# Patient Record
Sex: Male | Born: 1975 | Race: White | Hispanic: No | Marital: Married | Smoking: Never smoker
Health system: Southern US, Community
[De-identification: ages and names within clinical notes are randomized; demographics above are authoritative.]

---

## 2019-01-22 ENCOUNTER — Other Ambulatory Visit: Payer: Self-pay

## 2019-01-22 ENCOUNTER — Ambulatory Visit (HOSPITAL_COMMUNITY)
Admission: EM | Admit: 2019-01-22 | Discharge: 2019-01-23 | Disposition: A | Discharge status: Home / Self Care | Payer: Self-pay | Attending: Emergency Medicine | Admitting: Emergency Medicine

## 2019-01-22 DIAGNOSIS — K222 Esophageal obstruction: Secondary | ICD-10-CM | POA: Insufficient documentation

## 2019-01-22 DIAGNOSIS — X58XXXA Exposure to other specified factors, initial encounter: Secondary | ICD-10-CM | POA: Insufficient documentation

## 2019-01-22 DIAGNOSIS — Z1159 Encounter for screening for other viral diseases: Secondary | ICD-10-CM | POA: Insufficient documentation

## 2019-01-22 DIAGNOSIS — R1310 Dysphagia, unspecified: Secondary | ICD-10-CM | POA: Insufficient documentation

## 2019-01-22 DIAGNOSIS — T18128A Food in esophagus causing other injury, initial encounter: Secondary | ICD-10-CM | POA: Insufficient documentation

## 2019-01-22 DIAGNOSIS — K219 Gastro-esophageal reflux disease without esophagitis: Secondary | ICD-10-CM | POA: Insufficient documentation

## 2019-01-22 DIAGNOSIS — K449 Diaphragmatic hernia without obstruction or gangrene: Secondary | ICD-10-CM | POA: Insufficient documentation

## 2019-01-22 NOTE — ED Triage Notes (Signed)
Per pt he has hx of being trache and has hx of food getting stuck in his throat. Pt says he can feel the food  About half way down.

## 2019-01-23 ENCOUNTER — Encounter (HOSPITAL_COMMUNITY): Payer: Self-pay | Admitting: *Deleted

## 2019-01-23 ENCOUNTER — Encounter (HOSPITAL_COMMUNITY)
Admission: EM | Disposition: A | Discharge status: Home / Self Care | Payer: Self-pay | Source: Home / Self Care | Attending: Emergency Medicine

## 2019-01-23 ENCOUNTER — Emergency Department (HOSPITAL_COMMUNITY): Payer: Self-pay

## 2019-01-23 DIAGNOSIS — T18128A Food in esophagus causing other injury, initial encounter: Secondary | ICD-10-CM

## 2019-01-23 DIAGNOSIS — K449 Diaphragmatic hernia without obstruction or gangrene: Secondary | ICD-10-CM

## 2019-01-23 DIAGNOSIS — K222 Esophageal obstruction: Secondary | ICD-10-CM

## 2019-01-23 DIAGNOSIS — R1310 Dysphagia, unspecified: Secondary | ICD-10-CM

## 2019-01-23 HISTORY — PX: ESOPHAGOGASTRODUODENOSCOPY: SHX5428

## 2019-01-23 HISTORY — PX: FOREIGN BODY REMOVAL: SHX962

## 2019-01-23 LAB — CBC WITH DIFFERENTIAL/PLATELET
Abs Immature Granulocytes: 0.02 10*3/uL (ref 0.00–0.07)
Basophils Absolute: 0.1 10*3/uL (ref 0.0–0.1)
Basophils Relative: 1 %
Eosinophils Absolute: 0.4 10*3/uL (ref 0.0–0.5)
Eosinophils Relative: 6 %
HCT: 46.3 % (ref 39.0–52.0)
Hemoglobin: 14.8 g/dL (ref 13.0–17.0)
Immature Granulocytes: 0 %
Lymphocytes Relative: 26 %
Lymphs Abs: 1.8 10*3/uL (ref 0.7–4.0)
MCH: 29.2 pg (ref 26.0–34.0)
MCHC: 32 g/dL (ref 30.0–36.0)
MCV: 91.3 fL (ref 80.0–100.0)
Monocytes Absolute: 0.6 10*3/uL (ref 0.1–1.0)
Monocytes Relative: 9 %
Neutro Abs: 4 10*3/uL (ref 1.7–7.7)
Neutrophils Relative %: 58 %
Platelets: 177 10*3/uL (ref 150–400)
RBC: 5.07 MIL/uL (ref 4.22–5.81)
RDW: 13 % (ref 11.5–15.5)
WBC: 6.8 10*3/uL (ref 4.0–10.5)
nRBC: 0 % (ref 0.0–0.2)

## 2019-01-23 LAB — BASIC METABOLIC PANEL
Anion gap: 11 (ref 5–15)
BUN: 21 mg/dL — ABNORMAL HIGH (ref 6–20)
CO2: 26 mmol/L (ref 22–32)
Calcium: 9.7 mg/dL (ref 8.9–10.3)
Chloride: 104 mmol/L (ref 98–111)
Creatinine, Ser: 1.18 mg/dL (ref 0.61–1.24)
GFR calc Af Amer: >60 mL/min (ref >60)
GFR calc non Af Amer: >60 mL/min (ref >60)
Glucose, Bld: 95 mg/dL (ref 70–99)
Potassium: 3.6 mmol/L (ref 3.5–5.1)
Sodium: 141 mmol/L (ref 135–145)

## 2019-01-23 LAB — SARS CORONAVIRUS 2 BY RT PCR (HOSPITAL ORDER, PERFORMED IN ~~LOC~~ HOSPITAL LAB): SARS Coronavirus 2: NEGATIVE

## 2019-01-23 SURGERY — EGD (ESOPHAGOGASTRODUODENOSCOPY)
Anesthesia: Moderate Sedation

## 2019-01-23 MED ORDER — FENTANYL CITRATE (PF) 100 MCG/2ML IJ SOLN
INTRAMUSCULAR | Status: DC | PRN
  Administered 2019-01-23 (×2): 25 ug via INTRAVENOUS

## 2019-01-23 MED ORDER — OMEPRAZOLE 40 MG PO CPDR: 40.0000 mg | DELAYED_RELEASE_CAPSULE | Freq: Two times a day (BID) | ORAL | 3 refills | Status: AC

## 2019-01-23 MED ORDER — DIPHENHYDRAMINE HCL 50 MG/ML IJ SOLN
INTRAMUSCULAR | Status: AC
  Filled 2019-01-23: qty 1

## 2019-01-23 MED ORDER — FENTANYL CITRATE (PF) 100 MCG/2ML IJ SOLN
INTRAMUSCULAR | Status: AC
  Filled 2019-01-23: qty 4

## 2019-01-23 MED ORDER — ONDANSETRON HCL 4 MG/2ML IJ SOLN
4.0000 mg | Freq: Once | INTRAMUSCULAR | Status: AC
  Administered 2019-01-23: 4 mg via INTRAVENOUS
  Filled 2019-01-23: qty 2

## 2019-01-23 MED ORDER — SUCRALFATE 1 GM/10ML PO SUSP: 1.0000 g | Freq: Four times a day (QID) | ORAL | 1 refills | Status: AC

## 2019-01-23 MED ORDER — GLUCAGON HCL RDNA (DIAGNOSTIC) 1 MG IJ SOLR
1.0000 mg | Freq: Once | INTRAMUSCULAR | Status: AC
  Administered 2019-01-23: 1 mg via INTRAVENOUS
  Filled 2019-01-23: qty 1

## 2019-01-23 MED ORDER — MIDAZOLAM HCL (PF) 10 MG/2ML IJ SOLN
INTRAMUSCULAR | Status: DC | PRN
  Administered 2019-01-23: 1 mg via INTRAVENOUS
  Administered 2019-01-23: 2 mg via INTRAVENOUS
  Administered 2019-01-23: 1 mg via INTRAVENOUS

## 2019-01-23 MED ORDER — MIDAZOLAM HCL (PF) 5 MG/ML IJ SOLN
INTRAMUSCULAR | Status: AC
  Filled 2019-01-23: qty 3

## 2019-01-23 MED ORDER — LORAZEPAM 2 MG/ML IJ SOLN
0.5000 mg | Freq: Once | INTRAMUSCULAR | Status: AC
  Administered 2019-01-23: 0.5 mg via INTRAVENOUS
  Filled 2019-01-23: qty 1

## 2019-01-23 MED ORDER — BUTAMBEN-TETRACAINE-BENZOCAINE 2-2-14 % EX AERO
INHALATION_SPRAY | CUTANEOUS | Status: DC | PRN
  Administered 2019-01-23: 1 via TOPICAL

## 2019-01-23 NOTE — Interval H&P Note (Signed)
History and Physical Interval Note:  01/23/2019 5:15 AM  Devin Wiggins  has presented today for surgery, with the diagnosis of food bolus.  The various methods of treatment have been discussed with the patient and family. After consideration of risks, benefits and other options for treatment, the patient has consented to  Procedure(s): ESOPHAGOGASTRODUODENOSCOPY (EGD) (N/A) as a surgical intervention.  The patient's history has been reviewed, patient examined, no change in status, stable for surgery.  I have reviewed the patient's chart and labs.  Questions were answered to the patient's satisfaction.     Verlin Dike Terryann Verbeek

## 2019-01-23 NOTE — ED Notes (Signed)
Pt was taken up to endo, to get food taken out. Equipment was not working down here.

## 2019-01-23 NOTE — ED Provider Notes (Signed)
[facility] EMERGENCY DEPARTMENT Provider Note   CSN: [id number] Arrival date & time: 01/22/19  2320    History   Chief Complaint Chief Complaint  Patient presents with  . Emesis    food stuck in throat    HPI Devin Wiggins is a 43 y.o. male.     Patient with history of recurrent food impaction presenting with concern for same.  States has had difficulty swallowing ever since eating some chicken casserole tonight.  He is been spitting up an emesis bag and having difficult time controlling his secretions.  This is the fourth time this is happened.  Most recently this happened in [place] in 2015.  He has required endoscopy in the past.  Patient with history of tracheostomy remotely which is likely the source of this by his report.  He states he was feeling fine prior to eating the chicken tonight.  He denies any difficulty breathing.  No chest pain or abdominal pain.  No fevers or chills.  The history is provided by the patient.  Emesis  Associated symptoms: no abdominal pain, no arthralgias, no fever, no headaches and no myalgias     No past medical history on file.  There are no active problems to display for this patient.   * The histories are not reviewed yet. Please review them in the "History" navigator section and refresh this SmartLink.      Home Medications    Prior to Admission medications   Not on File    Family History No family history on file.  Social History Social History   Tobacco Use  . Smoking status: Not on file  Substance Use Topics  . Alcohol use: Not on file  . Drug use: Not on file     Allergies   Patient has no allergy information on record.   Review of Systems Review of Systems  Constitutional: Negative for activity change, appetite change and fever.  HENT: Negative for congestion and rhinorrhea.   Respiratory: Negative for shortness of breath.   Cardiovascular: Negative for chest pain.   Gastrointestinal: Positive for nausea and vomiting. Negative for abdominal pain.  Genitourinary: Negative for dysuria and hematuria.  Musculoskeletal: Negative for arthralgias and myalgias.  Skin: Negative for rash.  Neurological: Negative for dizziness, weakness and headaches.    all other systems are negative except as noted in the HPI and PMH.    Physical Exam Updated Vital Signs BP (!) 153/106 (BP Location: Right Arm)   Pulse 77   Temp 98.5 F (36.9 C) (Oral)   Resp 18   SpO2 98%   Physical Exam Vitals signs and nursing note reviewed.  Constitutional:      General: He is not in acute distress.    Appearance: He is well-developed.     Comments: Speaking in full sentences, no distress , Spitting into emesis bag  HENT:     Head: Normocephalic and atraumatic.     Mouth/Throat:     Pharynx: No oropharyngeal exudate.  Eyes:     Conjunctiva/sclera: Conjunctivae normal.     Pupils: Pupils are equal, round, and reactive to light.  Neck:     Musculoskeletal: Normal range of motion and neck supple.     Comments: No meningismus. Cardiovascular:     Rate and Rhythm: Normal rate and regular rhythm.     Heart sounds: Normal heart sounds. No murmur.  Pulmonary:     Effort: Pulmonary effort is normal. No respiratory distress.  Breath sounds: Normal breath sounds.  Abdominal:     Palpations: Abdomen is soft.     Tenderness: There is no abdominal tenderness. There is no guarding or rebound.  Musculoskeletal: Normal range of motion.        General: No tenderness.  Skin:    General: Skin is warm.  Neurological:     Mental Status: He is alert and oriented to person, place, and time.     Cranial Nerves: No cranial nerve deficit.     Motor: No abnormal muscle tone.     Coordination: Coordination normal.     Comments: No ataxia on finger to nose bilaterally. No pronator drift. 5/5 strength throughout. CN 2-12 intact.Equal grip strength. Sensation intact.   Psychiatric:         Behavior: Behavior normal.      ED Treatments / Results  Labs (all labs ordered are listed, but only abnormal results are displayed) Labs Reviewed  BASIC METABOLIC PANEL - Abnormal; Notable for the following components:      Result Value   BUN 21 (*)    All other components within normal limits  SARS CORONAVIRUS 2 (HOSPITAL ORDER, PERFORMED IN [facility])  CBC WITH DIFFERENTIAL/PLATELET    EKG None  Radiology Dg Chest Portable 1 View  Result Date: 01/23/2019 CLINICAL DATA:  Dysphagia EXAM: PORTABLE CHEST 1 VIEW COMPARISON:  None. FINDINGS: The heart size and mediastinal contours are within normal limits. Both lungs are clear. The visualized skeletal structures are unremarkable. IMPRESSION: Clear lungs. Electronically Signed   By: Deatra RobinsonKevin  Herman M.D.   On: 01/23/2019 00:46    Procedures Procedures (including critical care time)  Medications Ordered in ED Medications  glucagon (human recombinant) (GLUCAGEN) injection 1 mg (has no administration in time range)  LORazepam (ATIVAN) injection 0.5 mg (has no administration in time range)  ondansetron (ZOFRAN) injection 4 mg (has no administration in time range)     Initial Impression / Assessment and Plan / ED Course  I have reviewed the triage vital signs and the nursing notes.  Pertinent labs & imaging results that were available during my care of the patient were reviewed by me and considered in my medical decision making (see chart for details).       Concern for esophageal food impaction with history of the same.  Patient requesting glucagon and benzodiazepines and was would like to avoid endoscopy if possible.  Patient given IV Zofran, benzodiazepine and glucagon.  This was not successful and patient continued to spit in the emesis bag with difficulty controlling his secretions. Discussed with gastroenterology Dr. Barron Alvineirigliano.  He came to see patient.  EGD equipment was not functioning in the ED and  patient was taken to the endoscopy suite. Final Clinical Impressions(s) / ED Diagnoses   Final diagnoses:  Esophageal obstruction due to food impaction    ED Discharge Orders    None       Cally Nygard, Jeannett SeniorStephen, MD 01/23/19 0602

## 2019-01-23 NOTE — Consult Note (Signed)
Consultation  Referring Provider:     Glynn Octaveancour, Stephen, MD Primary Care Physician:  Patient, No Pcp Per Primary Gastroenterologist:     Gentry FitzUnassigned    Reason for Consultation:     Food impaction         HPI:   Devin Wiggins is a 43 y.o. male presenting to the ER with food impaction after eating chicken casserole earlier tonight. He has a hx of dysphagia, with several prior EGDs for food impactions, with the last episode approx 5 years ago. Was scheduled for EGD with dilation, but never completed this prior to moving from [place]. Did have an Esophageal Manometry completed there, which he thinks was abnormal, but not certain. 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 No issues with sedation with prior procedures. All previous GI/ER care was in [place]. He was o/w in his usual state of health before sxs onset. Has not been having dysphagia since impaction 5 years ago. Does take Prilosec 20 mg daily for hx of reflux.   Unable to tolerate secretions, sitting upright and spitting into emesis bag.   In the ER, no improvement with trial of Ativan, Glucagon, Zofran.  Normal CBC and CMP. Normal CXR. COVID-19 negative.  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 Smoking status: Not on file  Substance Use Topics  . Alcohol use: Not on file  . Drug use: Not on file    Prior to Admission medications   Medication Sig Start Date End Date Taking? Authorizing Provider  omeprazole (PRILOSEC) 20 MG capsule Take 20 mg by mouth daily.   Yes [provider]    No current facility-administered medications for this encounter.    Current Outpatient Medications  Medication Sig Dispense Refill  . omeprazole (PRILOSEC) 20 MG capsule Take 20 mg by mouth daily.      Allergies as of 01/22/2019  . (Not on File)     Review of Systems:    As per HPI,  otherwise negative    Physical Exam:  Vital signs in last 24 hours: Temp:  [97.8 F (36.6 C)-98.5 F (36.9 C)] 98.5 F (36.9 C) (05/14 2333) Pulse Rate:  [66-77] 66 (05/15 0015) Resp:  [16-18] 18 (05/14 2333) BP: (140-153)/(98-106) 143/98 (05/15 0015) SpO2:  [96 %-98 %] 96 % (05/15 0015)   General:   Pleasant male in NAD Head:  Normocephalic and atraumatic. Eyes:   No icterus.   Conjunctiva pink. Ears:  Normal auditory acuity. Neck:  Supple Lungs:  Respirations even and unlabored. Lungs clear to auscultation bilaterally.   No wheezes, crackles, or rhonchi.  Heart:  Regular rate and rhythm; no MRG Abdomen:  Soft, nondistended, nontender. Normal bowel sounds. No appreciable masses or hepatomegaly.  Rectal:  Not performed.  Msk:  Symmetrical without gross deformities.  Extremities:  Without edema. Neurologic:  Alert and  oriented x4;  grossly normal neurologically. Skin:  Intact without significant lesions or rashes. Psych:  Alert and cooperative. Normal affect.  LAB RESULTS: Recent Labs    01/23/19 0010  WBC 6.8  HGB 14.8  HCT 46.3  PLT 177   BMET Recent Labs    01/23/19 0010  NA 141  K 3.6  CL 104  CO2 26  GLUCOSE 95  BUN 21*  CREATININE 1.18  CALCIUM 9.7  LFT No results for input(s): PROT, ALBUMIN, AST, ALT, ALKPHOS, BILITOT, BILIDIR, IBILI in the last 72 hours. PT/INR No results for input(s): LABPROT, INR in the last 72 hours.  STUDIES: Dg Chest Portable 1 View  Result Date: 01/23/2019 CLINICAL DATA:  Dysphagia EXAM: PORTABLE CHEST 1 VIEW COMPARISON:  None. FINDINGS: The heart size and mediastinal contours are within normal limits. Both lungs are clear. The visualized skeletal structures are unremarkable. IMPRESSION: Clear lungs. Electronically Signed   By: Deatra Robinson M.D.   On: 01/23/2019 00:46     PREVIOUS ENDOSCOPIES:            All previous enodoscopies in [place]. Last approx 5 years ago per patient. None in EMR for review.    Impression /  Plan:   43 yo male with acute food impaction after eating chicken caserole this evening.   - Plan for emergent EGD in the ER with moderate sedation and food retrieval - Confirmed no allergies to sedating medications  The indications, risks, and benefits of EGD were explained to the patient in detail. Risks include but are not limited to bleeding, perforation, adverse reaction to medications, and cardiopulmonary compromise. Sequelae include but are not limited to the possibility of surgery, hositalization, and mortality. The patient verbalized understanding and wished to proceed. All questions answered. Additonal recommendations pending endoscopic findings.   Thanks   LOS: 0 days   Shellia Cleverly  01/23/2019, 1:29 AM

## 2019-01-23 NOTE — ED Provider Notes (Signed)
Pt is now awake after EGD by Dr. Barron Alvine.  Pt is stable for d/c.  Return if worse.  F/u with [facility].   Jacalyn Lefevre, MD 01/23/19 [phone]

## 2019-01-23 NOTE — Discharge Instructions (Signed)
Full liquid diet today and advance slowly as tolerated to soft foods tomorrow. Do not advance beyond soft foods until follow-up appointment in the GI Clinic. - Increase Prilosec (omeprazole) to 40 mg PO BID for 8 weeks to promote mucosal healing, then can reduce to 40 mg daily. - Use sucralfate suspension 1 gram PO QID for 2 weeks. - Return to GI clinic in 2 weeks. Will call to schedule this appointment. - Repeat upper endoscopy in 4 weeks to check healing and plan for esophageal biopsies and esophageal dilation at that time.  ---

## 2019-01-23 NOTE — Op Note (Signed)
[facility] Patient Name: Devin ReadyJeremy Gelber Procedure Date : 01/23/2019 MRN: [id number] Attending MD: Doristine LocksVito Cirigliano , MD Date of Birth: 24-Nov-1975 CSN: [id number] Age: [age] Admit Type: Emergency Department Procedure:                Upper GI endoscopy Indications:              Dysphagia, Foreign body in the esophagus (food                            impaction)                           43 yo male with history of food impactions, with                            the last occurance in 2016 while still living in                            [place], presents to the ER with acute food                            impaction after eating chicken. Providers:                Doristine LocksVito Cirigliano, MD, Janae SauceKaren D. Hinson, RN, Harrington ChallengerHope                            Parker, Technician Referring MD:              Medicines:                Midazolam 4 mg IV, Fentanyl 50 micrograms IV Complications:            No immediate complications. Estimated Blood Loss:     Estimated blood loss was minimal. Procedure:                Pre-Anesthesia Assessment:                           - Prior to the procedure, a History and Physical                            was performed, and patient medications and                            allergies were reviewed. The patient's tolerance of                            previous anesthesia was also reviewed. The risks                            and benefits of the procedure and the sedation                            options and risks were discussed with the patient.  All questions were answered, and informed consent                            was obtained. Prior Anticoagulants: The patient has                            taken no previous anticoagulant or antiplatelet                            agents. ASA Grade Assessment: I - A normal, healthy                            patient. After reviewing the risks and benefits,                            the  patient was deemed in satisfactory condition to                            undergo the procedure.                           After obtaining informed consent, the endoscope was                            passed under direct vision. Throughout the                            procedure, the patient's blood pressure, pulse, and                            oxygen saturations were monitored continuously. The                            [id number]-H190 ([id number]) Olympus gastroscope was                            introduced through the mouth, and advanced to the                            second part of duodenum. The upper GI endoscopy was                            accomplished without difficulty. The patient                            tolerated the procedure well. Scope In: Scope Out: Findings:      Food bolus were found in the lower third of the esophagus. Removal was       accomplished with a rat-toothed forceps.      One benign-appearing, intrinsic moderate stenosis was found 35 cm from       the incisors. This stenosis measured 1 cm (in length). The stenosis was       traversed. There was felinization in the lower esophagus and some       longitudinal furrows  in the mid esophagus, suggestive of Esoinophilic       Esophagitis. Biopsies were deferred to time of repeat endoscopy.      A 4 cm hiatal hernia was present.      The entire examined stomach was normal.      The duodenal bulb, first portion of the duodenum and second portion of       the duodenum were normal. Impression:               - Food bolus were found in the esophagus. Removal                            was successful.                           - Benign-appearing esophageal stenosis.                           - 4 cm hiatal hernia.                           - Normal stomach.                           - Normal duodenal bulb, first portion of the                            duodenum and second portion of the duodenum. Recommendation:            - Return patient to ER for possible discharge same                            day.                           - Full liquid diet today and advance slowly as                            tolerated to soft foods tomorrow. Do not advance                            beyond soft foods until follow-up appointment in                            the GI Clinic.                           - Increase Prilosec (omeprazole) to 40 mg PO BID                            for 8 weeks to promote mucosal healing, then can                            reduce to 40 mg daily.                           - Use sucralfate suspension 1  gram PO QID for 2                            weeks.                           - Return to GI clinic in 2 weeks. Will call to                            schedule this appointment.                           - Repeat upper endoscopy in 4 weeks to check                            healing and plan for esophageal biopsies and                            esophageal dilation at that time. Procedure Code(s):        --- Professional ---                           [id number], Esophagogastroduodenoscopy, flexible,                            transoral; with removal of foreign body(s) Diagnosis Code(s):        --- Professional ---                           [phone], Other foreign object in esophagus causing                            other injury, initial encounter                           K22.2, Esophageal obstruction                           K44.9, Diaphragmatic hernia without obstruction or                            gangrene                           R13.10, Dysphagia, unspecified                           T18.108A, Unspecified foreign body in esophagus                            causing other injury, initial encounter CPT copyright 2019 American Medical Association. All rights reserved. The codes documented in this report are preliminary and upon coder review may  be revised to meet current  compliance requirements. Doristine Locks, MD 01/23/2019 6:02:53 AM Number of Addenda: 0

## 2019-01-23 NOTE — H&P (View-Only) (Signed)
  Consultation  Referring Provider:     Rancour, Stephen, MD Primary Care Physician:  Patient, No Pcp Per Primary Gastroenterologist:     Unassigned    Reason for Consultation:     Food impaction         HPI:   Devin Wiggins is a 43 y.o. male presenting to the ER with food impaction after eating chicken casserole earlier tonight. He has a hx of dysphagia, with several prior EGDs for food impactions, with the last episode approx 5 years ago. Was scheduled for EGD with dilation, but never completed this prior to moving from [place]. Did have an Esophageal Manometry completed there, which he thinks was abnormal, but not certain. 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 No issues with sedation with prior procedures. All previous GI/ER care was in [place]. He was o/w in his usual state of health before sxs onset. Has not been having dysphagia since impaction 5 years ago. Does take Prilosec 20 mg daily for hx of reflux.   Unable to tolerate secretions, sitting upright and spitting into emesis bag.   In the ER, no improvement with trial of Ativan, Glucagon, Zofran.  Normal CBC and CMP. Normal CXR. COVID-19 negative.  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 Smoking status: Not on file  Substance Use Topics  . Alcohol use: Not on file  . Drug use: Not on file    Prior to Admission medications   Medication Sig Start Date End Date Taking? Authorizing Provider  omeprazole (PRILOSEC) 20 MG capsule Take 20 mg by mouth daily.   Yes [provider]    No current facility-administered medications for this encounter.    Current Outpatient Medications  Medication Sig Dispense Refill  . omeprazole (PRILOSEC) 20 MG capsule Take 20 mg by mouth daily.      Allergies as of 01/22/2019  . (Not on File)     Review of Systems:    As per HPI,  otherwise negative    Physical Exam:  Vital signs in last 24 hours: Temp:  [97.8 F (36.6 C)-98.5 F (36.9 C)] 98.5 F (36.9 C) (05/14 2333) Pulse Rate:  [66-77] 66 (05/15 0015) Resp:  [16-18] 18 (05/14 2333) BP: (140-153)/(98-106) 143/98 (05/15 0015) SpO2:  [96 %-98 %] 96 % (05/15 0015)   General:   Pleasant male in NAD Head:  Normocephalic and atraumatic. Eyes:   No icterus.   Conjunctiva pink. Ears:  Normal auditory acuity. Neck:  Supple Lungs:  Respirations even and unlabored. Lungs clear to auscultation bilaterally.   No wheezes, crackles, or rhonchi.  Heart:  Regular rate and rhythm; no MRG Abdomen:  Soft, nondistended, nontender. Normal bowel sounds. No appreciable masses or hepatomegaly.  Rectal:  Not performed.  Msk:  Symmetrical without gross deformities.  Extremities:  Without edema. Neurologic:  Alert and  oriented x4;  grossly normal neurologically. Skin:  Intact without significant lesions or rashes. Psych:  Alert and cooperative. Normal affect.  LAB RESULTS: Recent Labs    01/23/19 0010  WBC 6.8  HGB 14.8  HCT 46.3  PLT 177   BMET Recent Labs    01/23/19 0010  NA 141  K 3.6  CL 104  CO2 26  GLUCOSE 95  BUN 21*  CREATININE 1.18  CALCIUM 9.7     LFT No results for input(s): PROT, ALBUMIN, AST, ALT, ALKPHOS, BILITOT, BILIDIR, IBILI in the last 72 hours. PT/INR No results for input(s): LABPROT, INR in the last 72 hours.  STUDIES: Dg Chest Portable 1 View  Result Date: 01/23/2019 CLINICAL DATA:  Dysphagia EXAM: PORTABLE CHEST 1 VIEW COMPARISON:  None. FINDINGS: The heart size and mediastinal contours are within normal limits. Both lungs are clear. The visualized skeletal structures are unremarkable. IMPRESSION: Clear lungs. Electronically Signed   By: Deatra Robinson M.D.   On: 01/23/2019 00:46     PREVIOUS ENDOSCOPIES:            All previous enodoscopies in [place]. Last approx 5 years ago per patient. None in EMR for review.    Impression /  Plan:   43 yo male with acute food impaction after eating chicken caserole this evening.   - Plan for emergent EGD in the ER with moderate sedation and food retrieval - Confirmed no allergies to sedating medications  The indications, risks, and benefits of EGD were explained to the patient in detail. Risks include but are not limited to bleeding, perforation, adverse reaction to medications, and cardiopulmonary compromise. Sequelae include but are not limited to the possibility of surgery, hositalization, and mortality. The patient verbalized understanding and wished to proceed. All questions answered. Additonal recommendations pending endoscopic findings.   Thanks   LOS: 0 days   Shellia Cleverly  01/23/2019, 1:29 AM

## 2019-02-05 ENCOUNTER — Ambulatory Visit: Payer: Self-pay | Admitting: Gastroenterology

## 2019-02-24 ENCOUNTER — Encounter: Payer: Self-pay | Admitting: Gastroenterology

## 2019-04-28 ENCOUNTER — Telehealth: Payer: Self-pay

## 2019-04-28 NOTE — Telephone Encounter (Signed)
Left message for patient to call back  

## 2019-04-28 NOTE — Telephone Encounter (Signed)
-----   Message from Pulcifer, DO sent at 04/28/2019  7:48 AM EDT ----- This patient was seen in May for acute food impaction with emergent EGD performed. Looks like he has ben lost to f/u. Can you inquire whether he still lives in the area, and if so, would he like to follow-up with [place]? 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 Thanks.

## 2019-04-30 NOTE — Telephone Encounter (Signed)
Left message for patient to call back  

## 2019-05-05 NOTE — Telephone Encounter (Signed)
Patient has failed to return a call to the office-plan?

## 2019-05-05 NOTE — Telephone Encounter (Signed)
I dont see that he has had any recent care in the area, so possible that he has moved away from [facility]. Hopefully he will reach out if he is in fact still local and/or needs assistance. Thanks for leaving him messages.

## 2020-07-21 IMAGING — DX PORTABLE CHEST - 1 VIEW
1 series · 1 of 1 positions shown · non-contrast
Comparison: None.

CLINICAL DATA: Dysphagia

EXAM:
PORTABLE CHEST 1 VIEW

[chest ap]
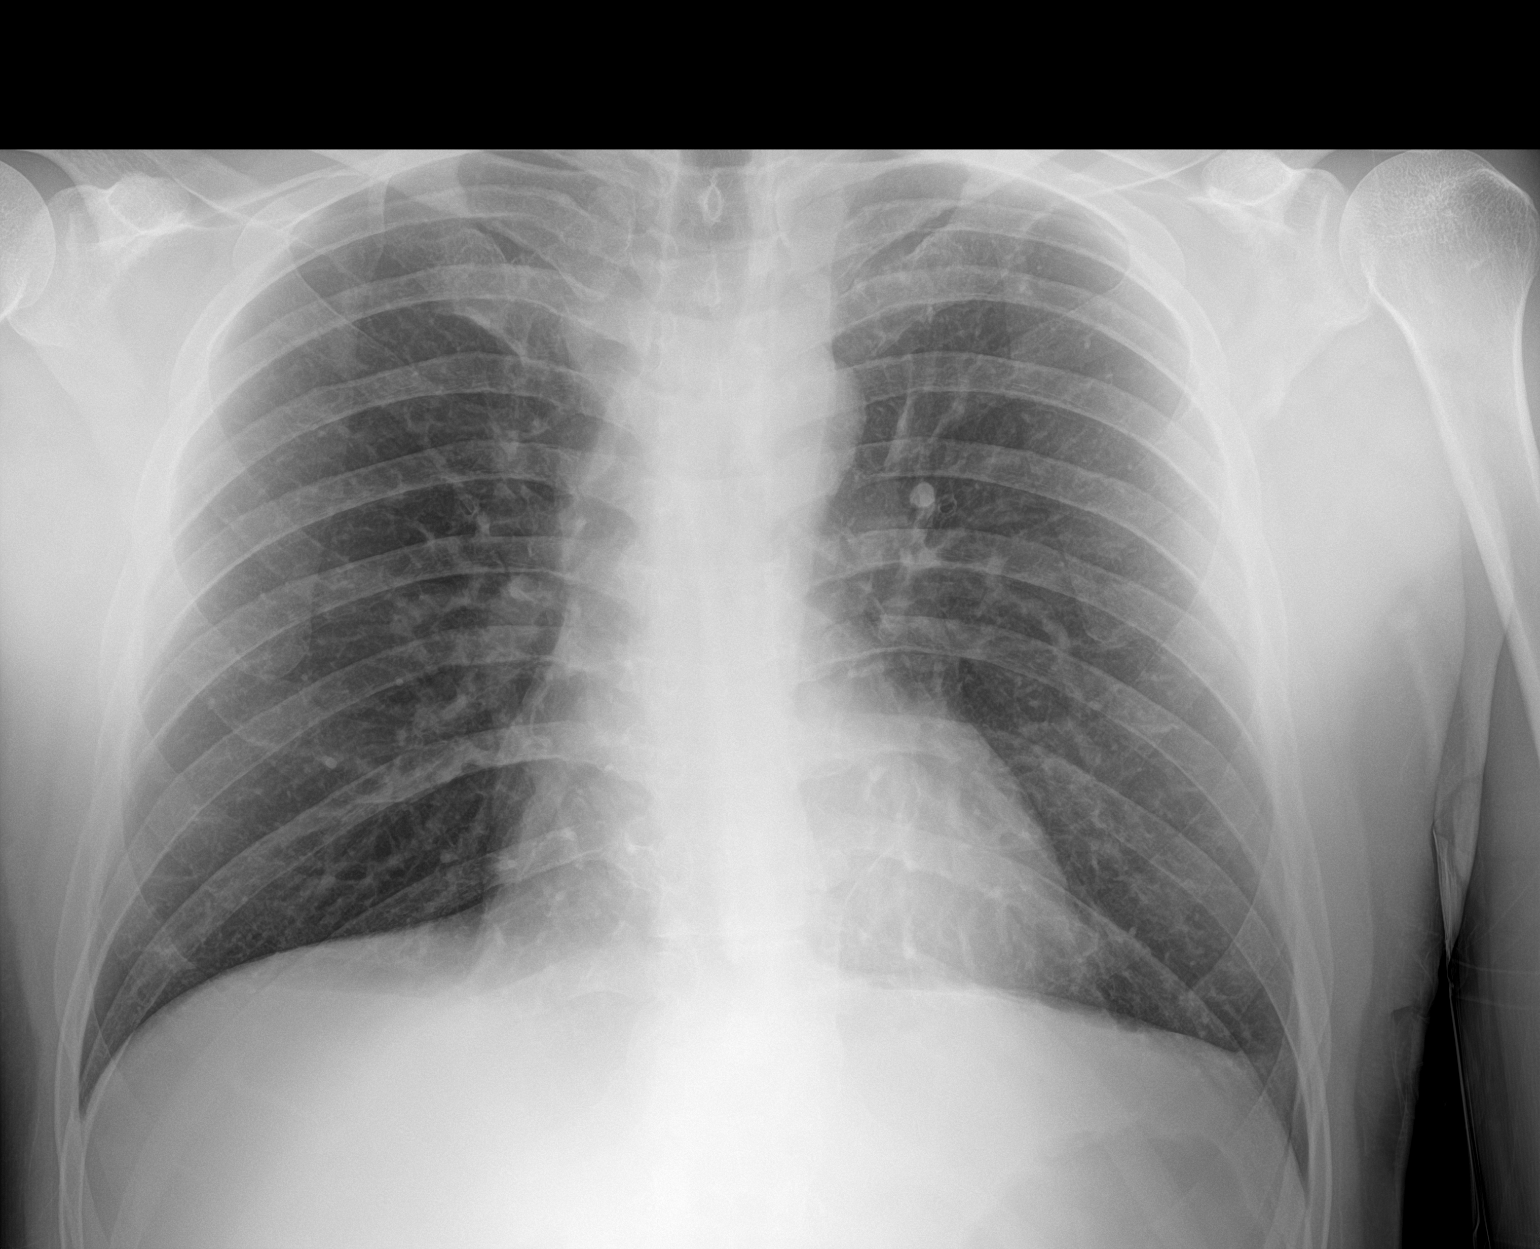

[1 of 1 positions shown; findings below may reference images not displayed]

FINDINGS: The heart size and mediastinal contours are within normal limits.
Both lungs are clear. The visualized skeletal structures are
unremarkable.
IMPRESSION: Clear lungs.
